# Patient Record
Sex: Male | Born: 1975 | Race: Black or African American | Hispanic: No | Marital: Married | Smoking: Current every day smoker
Health system: Southern US, Community
[De-identification: ages and names within clinical notes are randomized; demographics above are authoritative.]

---

## 2002-04-05 ENCOUNTER — Emergency Department (HOSPITAL_COMMUNITY): Admission: EM | Admit: 2002-04-05 | Discharge: 2002-04-05 | Payer: Self-pay | Admitting: Emergency Medicine

## 2007-03-13 ENCOUNTER — Emergency Department (HOSPITAL_COMMUNITY): Admission: EM | Admit: 2007-03-13 | Discharge: 2007-03-14 | Payer: Self-pay | Admitting: Podiatry

## 2007-03-22 ENCOUNTER — Emergency Department (HOSPITAL_COMMUNITY): Admission: EM | Admit: 2007-03-22 | Discharge: 2007-03-22 | Payer: Self-pay | Admitting: Emergency Medicine

## 2007-03-29 ENCOUNTER — Inpatient Hospital Stay (HOSPITAL_COMMUNITY): Admission: EM | Admit: 2007-03-29 | Discharge: 2007-03-31 | Payer: Self-pay | Admitting: Emergency Medicine

## 2007-03-29 ENCOUNTER — Ambulatory Visit: Payer: Self-pay | Admitting: Internal Medicine

## 2007-04-13 ENCOUNTER — Ambulatory Visit: Payer: Self-pay | Admitting: Internal Medicine

## 2007-04-13 ENCOUNTER — Encounter (INDEPENDENT_AMBULATORY_CARE_PROVIDER_SITE_OTHER): Payer: Self-pay | Admitting: *Deleted

## 2007-04-13 DIAGNOSIS — K859 Acute pancreatitis without necrosis or infection, unspecified: Secondary | ICD-10-CM

## 2007-04-13 DIAGNOSIS — F1010 Alcohol abuse, uncomplicated: Secondary | ICD-10-CM | POA: Insufficient documentation

## 2008-04-30 ENCOUNTER — Emergency Department (HOSPITAL_COMMUNITY): Admission: EM | Admit: 2008-04-30 | Discharge: 2008-04-30 | Payer: Self-pay | Admitting: Emergency Medicine

## 2010-08-18 ENCOUNTER — Inpatient Hospital Stay (HOSPITAL_COMMUNITY)
Admission: EM | Admit: 2010-08-18 | Discharge: 2010-08-19 | Payer: Self-pay | Source: Home / Self Care | Attending: Pulmonary Disease | Admitting: Pulmonary Disease

## 2010-09-15 NOTE — Discharge Summary (Addendum)
NAME:  [name], [name] NO.:  [id number]  MEDICAL RECORD NO.:  [id number]          PATIENT TYPE:  INP  LOCATION:  2306                         FACILITY:  [facility]  PHYSICIAN:  Oretha Milch, MD      DATE OF BIRTH:  [birth year]  DATE OF ADMISSION:  08/18/2010 DATE OF DISCHARGE:  08/19/2010                              DISCHARGE SUMMARY   DISCHARGE DIAGNOSES: 1. Alcohol intoxication. 2. Respiratory failure.  HISTORY OF PRESENT ILLNESS:  Mr. Devon Garcia is a 35 year old male with no previous past medical history who was brought to the emergency room by EMS for altered mental status.  He was noted by bystanders to be drinking alcohol and at some point was witnessed to lose consciousness. He was brought to the emergency room where he was intubated for airway protection and pulmonary critical care was asked to admit.  Of note, alcohol level on admission was 575.  LABORATORY DATA:  At the time of admission, August 13, 1959, urinalysis was negative.  CBC, white blood cells 4.2, hemoglobin 16.2, hematocrit 47.9, platelets 190.  Alcohol level was 575.  Basic metabolic panel, sodium 144, potassium 3.4, glucose 124, BUN 7, creatinine 1.06. Urine drug screen was negative.  Most recent laboratory data, August 19, 2010, base metabolic panel, sodium 139, potassium 3.8, glucose 96, BUN 8, creatinine 0.89, phosphorus 3.5, magnesium 1.5.  MRSA PCR screen was negative.  RADIOLOGY DATA:  Portable chest x-ray on August 18, 2010, shows subsegmental left lower lobe atelectasis.  CT of the head shows no acute findings.  CT of the C-spine also no acute findings.  MICRODATA:  No microdata available this admission.  HOSPITAL COURSE BY DISCHARGE DIAGNOSES: 1. Acute alcohol intoxication.  The patient post extubation admits to     regularly drinking hard liquor.  His alcohol level was 575 on     admission.  At the time of discharge, the patient is awake, alert.     He has had some slight  intermittent signs and symptoms of alcohol     withdrawal, which he does state he has had in the past when he     tried to quit approximately 3 years ago.  He has been treated with     p.r.n. p.o. Ativan, which he will be discharged on.  Social work     has seen the patient in consultation for assistance with substance     abuse.  The patient was treated with thiamine and again has been     urged to stop drinking and social work has consulted with the     patient for outpatient options for substance abuse. 2. Respiratory failure secondary to acute alcohol intoxication.  This     is resolved.  The patient is self-extubated in the early morning of     August 18, 2010, has been extubated for approximately 24 hours,     and his respiratory status is stable.  He is saturating well on     room air with no shortness of breath, no increased work of     breathing with exertion and ambulation.  DISCHARGE MEDICATIONS:  Ativan 1 mg p.o. q.4 h. p.r.n. as needed for shakes, tremors, and signs of alcohol withdrawal  DISCHARGE ACTIVITY:  The patient is discharged with activity as tolerated.  DISCHARGE DIET:  The patient is discharged on regular diet.  FOLLOWUP:  Of note, the patient does not have primary care physician and when asked he refused to search for one and states that he does not need any followup as he has no chronic medical problems.  The patient has been given Dr. Ma Hillock name and office number and can follow up with Pulmonary Critical Care as needed.  DISPOSITION:  The patient has met maximum benefit from his inpatient hospitalization.  His acute alcohol intoxication and respiratory failure resolved.  He will be given a prescription for p.r.n. Ativan for signs and symptoms of alcohol withdrawal and has been seen in consultation by social work for substance abuse.  The patient will be discharged home and can follow up on a p.r.n. basis.     Devon Dress,  NP   ______________________________ Oretha Milch, MD    KW/MEDQ  D:  08/19/2010  T:  08/19/2010  Job:  [id number]  Electronically Signed by Danford Bad N.P. on 09/03/2010 11:34:45 AM Electronically Signed by Cyril Mourning MD on 09/15/2010 09:06:04 PM

## 2010-09-26 ENCOUNTER — Emergency Department (HOSPITAL_COMMUNITY): Payer: Self-pay

## 2010-09-26 ENCOUNTER — Emergency Department (HOSPITAL_COMMUNITY)
Admission: EM | Admit: 2010-09-26 | Discharge: 2010-09-26 | Disposition: A | Discharge status: Home / Self Care | Payer: Self-pay | Attending: Emergency Medicine | Admitting: Emergency Medicine

## 2010-09-26 DIAGNOSIS — M25476 Effusion, unspecified foot: Secondary | ICD-10-CM | POA: Insufficient documentation

## 2010-09-26 DIAGNOSIS — S93409A Sprain of unspecified ligament of unspecified ankle, initial encounter: Secondary | ICD-10-CM | POA: Insufficient documentation

## 2010-09-26 DIAGNOSIS — X500XXA Overexertion from strenuous movement or load, initial encounter: Secondary | ICD-10-CM | POA: Insufficient documentation

## 2010-09-26 DIAGNOSIS — S99919A Unspecified injury of unspecified ankle, initial encounter: Secondary | ICD-10-CM | POA: Insufficient documentation

## 2010-09-26 DIAGNOSIS — Y92009 Unspecified place in unspecified non-institutional (private) residence as the place of occurrence of the external cause: Secondary | ICD-10-CM | POA: Insufficient documentation

## 2010-09-26 DIAGNOSIS — M25473 Effusion, unspecified ankle: Secondary | ICD-10-CM | POA: Insufficient documentation

## 2010-09-26 DIAGNOSIS — S9000XA Contusion of unspecified ankle, initial encounter: Secondary | ICD-10-CM | POA: Insufficient documentation

## 2010-09-26 DIAGNOSIS — S82839A Other fracture of upper and lower end of unspecified fibula, initial encounter for closed fracture: Secondary | ICD-10-CM | POA: Insufficient documentation

## 2010-09-26 DIAGNOSIS — S8990XA Unspecified injury of unspecified lower leg, initial encounter: Secondary | ICD-10-CM | POA: Insufficient documentation

## 2010-11-03 LAB — RAPID URINE DRUG SCREEN, HOSP PERFORMED
Barbiturates: NOT DETECTED
Cocaine: NOT DETECTED

## 2010-11-03 LAB — CBC
HCT: 47.9 % (ref 39.0–52.0)
MCH: 31 pg (ref 26.0–34.0)
MCV: 91.6 fL (ref 78.0–100.0)
RBC: 5.23 MIL/uL (ref 4.22–5.81)
WBC: 4.2 10*3/uL (ref 4.0–10.5)

## 2010-11-03 LAB — URINE MICROSCOPIC-ADD ON

## 2010-11-03 LAB — URINALYSIS, ROUTINE W REFLEX MICROSCOPIC
Bilirubin Urine: NEGATIVE
Glucose, UA: NEGATIVE mg/dL
Leukocytes, UA: NEGATIVE
Nitrite: NEGATIVE
Specific Gravity, Urine: 1.006 (ref 1.005–1.030)
pH: 5.5 (ref 5.0–8.0)

## 2010-11-03 LAB — DIFFERENTIAL
Eosinophils Relative: 0 % (ref 0–5)
Lymphocytes Relative: 45 % (ref 12–46)
Lymphs Abs: 1.9 10*3/uL (ref 0.7–4.0)
Monocytes Absolute: 0.5 10*3/uL (ref 0.1–1.0)
Monocytes Relative: 12 % (ref 3–12)

## 2010-11-03 LAB — BASIC METABOLIC PANEL
BUN: 8 mg/dL (ref 6–23)
CO2: 24 mEq/L (ref 19–32)
CO2: 30 mEq/L (ref 19–32)
Chloride: 103 mEq/L (ref 96–112)
Chloride: 107 mEq/L (ref 96–112)
GFR calc Af Amer: >60 mL/min (ref >60)
Glucose, Bld: 96 mg/dL (ref 70–99)
Potassium: 3.4 mEq/L — ABNORMAL LOW (ref 3.5–5.1)
Potassium: 3.8 mEq/L (ref 3.5–5.1)

## 2010-11-03 LAB — ETHANOL: Alcohol, Ethyl (B): 575 mg/dL (ref 0–10)

## 2010-11-03 LAB — MRSA PCR SCREENING: MRSA by PCR: NEGATIVE

## 2010-11-03 LAB — MAGNESIUM: Magnesium: 1.5 mg/dL (ref 1.5–2.5)

## 2011-01-06 NOTE — Discharge Summary (Signed)
NAME:  Devon Garcia, Devon Garcia NO.:  [id number]   MEDICAL RECORD NO.:  [id number]          PATIENT TYPE:  INP   LOCATION:  5730                         FACILITY:  [facility]   PHYSICIAN:  Jason Coop, MD DATE OF BIRTH:  [birth year]   DATE OF ADMISSION:  03/29/2007  DATE OF DISCHARGE:  03/31/2007                               DISCHARGE SUMMARY   DISCHARGE DIAGNOSES:  1. Pancreatitis, mild.  2. Questionable hypertension.  3. Hypokalemia, secondary to decreased intake.   MEDICATIONS ON DISCHARGE:  1. Percocet 1 pill p.o. p.r.n. q.i.d.  2. Zofran 4 mg p.o. p.r.n. b.i.d.  3. Thiamine 5 mg p.o. daily.  4. Folic acid 1 mg p.o. daily.  5. Omeprazole 20 mg  p.o. daily for 10 days.   DISPOSITION:  The patient is discharged home.  He will see Dr. Laveda Abbe  on August 28 at 2:30 p.m.  He needs to be followed for any persistent  signs of pancreatitis including pain, nausea/vomiting or any other  complications.  His primary care physician is to decide whether and how  long his thiamine and folic acid will be continued.  His primary care  physician should follow his CMP, CBC and lipase done on August 8 and he  is also getting BMET and lipase 1 to 2 days prior to coming to the  clinic. Please follow up his BP. It has been high, diastolic above 90  and systolic above 130 during the hospital stay.   PROCEDURE:  CT scan of the abdomen and pelvis with contrast dated August  5 is positive for findings consistent with acute pancreatitis.  His  lipase on March 29, 2007 was 308.   CONSULTS:  None.   HISTORY OF PRESENT ILLNESS:  A 35 year old African American male who  came with history of 1 1/2 week of increasing abdominal pain and  tenderness.  It is located in the epigastric area and right lower  quadrant.  It was constant and sometimes it radiates to the right flank.  He took Zantac but pain was increasing in severity.  He also had 2  episodes of nonbloody emesis.  The first one 3  days prior to admission  and the second one, 1 day prior to admission.  Also, had some mild  weight loss due to decreased p.o. intake.  No history of blood in the  stools.  No chills or fever.  He has never had any type of pain.   PAST MEDICAL HISTORY:  Insignificant.   SOCIAL HISTORY:  He drinks about 2-3 drinks per day.   PHYSICAL EXAMINATION:  VITAL SIGNS:  Temperature 98.2, blood pressure  123/84, pulse 78, respirations 80, saturating 97% on room air.  GENERAL:  Resting.  ENT:  No oropharyngeal icterus or erythema.  NECK:  Supple.  No JVD or lymphadenopathy.  CHEST:  Clear to auscultation bilaterally.  HEART:  Regular rate and rhythm.  No murmurs, rubs or gallops.  2+  radial pulses bilaterally.  ABDOMEN:  Bowel sounds absent.  Soft and nontender.  No organomegaly.  EXTREMITIES:  No edema.  No cervical or supraclavicular  lymphadenopathy.   LABS ON ADMISSION:  WBC 5.4, hemoglobin 13.8, platelets 244, ANC 3.4,  MCV 90, sodium 130, potassium 3.4, chloride 99, bicarb 31, BUN 8,  creatinine 0.9, glucose 108, bilirubin 1, alk phos 117, SGOT 197, SGPT  107, protein 7.2, albumin 4.2, calcium 9.3, lipase 308, UA positive for  ketones, protein is 30 and trace leukocytes.  CK 255, CK-MB 1.7,  relative index 0.7, alcohol level less than 0.5, LD is 176, UD screen  only positive for opiates, HIV nonreactive.   HOSPITAL COURSE:  1. Pancreatitis.  He was treated with NPO IV fluids, adequate      analgesics and antiemetics and on the next day of admission his      pain is improved and he did not have any nausea or vomiting.  We      started him on a liquid diet and he tolerated it well.  We also      gave him Protonix.  2. Hypertension.  His blood pressures in the hospital have been at the      upper levels.  His diastolic levels have been always more than 90.      His systolic blood pressure always above 130.  Being young, just 35      years old with persistent mildly elevated blood  pressure, he needs      followup for it.  3. Hypokalemia. Repletion.   DISCHARGE LABS:  CBC 5.8, hemoglobin 13.1, platelets 211, MCV 91.5, RDW  13.4, cholesterol 144, triglycerides 61, HDL 65, LDL 68, VLL 12, total  cholesterol to STL ratio 2.2.  Sodium 136, potassium 3.2, chloride 102,  bicarb 27, BUN 3, creatinine 0.79, glucose 112 and calcium 8.9.   Vitals at discharge:  Temperature 98.5, blood pressure 139/91, pulse 73,  respirations 20, saturating 99% on room air.   CONDITION ON DISCHARGE:  Without pain, nausea or vomiting.  Tolerating  liquid diet.      Jason Coop, MD  Electronically Signed     YP/MEDQ  D:  03/31/2007  T:  04/01/2007  Job:  [id number]   cc:   Hollace Hayward, M.D.

## 2011-05-27 LAB — CBC
Hemoglobin: 15.1
MCV: 91.1
RBC: 4.95
WBC: 6.8

## 2011-05-27 LAB — RAPID URINE DRUG SCREEN, HOSP PERFORMED
Amphetamines: NOT DETECTED
Barbiturates: NOT DETECTED
Benzodiazepines: NOT DETECTED
Cocaine: NOT DETECTED
Opiates: NOT DETECTED
Tetrahydrocannabinol: POSITIVE — AB

## 2011-05-27 LAB — DIFFERENTIAL
Eosinophils Absolute: 0
Lymphs Abs: 2.7
Monocytes Relative: 10
Neutrophils Relative %: 50

## 2011-05-27 LAB — URINALYSIS, ROUTINE W REFLEX MICROSCOPIC
Glucose, UA: NEGATIVE
Hgb urine dipstick: NEGATIVE
Protein, ur: NEGATIVE
Specific Gravity, Urine: 1.006

## 2011-06-08 LAB — RAPID URINE DRUG SCREEN, HOSP PERFORMED
Amphetamines: NOT DETECTED
Barbiturates: NOT DETECTED
Benzodiazepines: NOT DETECTED
Opiates: POSITIVE — AB

## 2011-06-08 LAB — DIFFERENTIAL
Basophils Relative: 1
Eosinophils Absolute: 0
Eosinophils Relative: 1
Monocytes Absolute: 0.7
Monocytes Relative: 14 — ABNORMAL HIGH
Neutrophils Relative %: 64

## 2011-06-08 LAB — URINE CULTURE: Colony Count: 4000

## 2011-06-08 LAB — URINALYSIS, ROUTINE W REFLEX MICROSCOPIC
Bilirubin Urine: NEGATIVE
Glucose, UA: NEGATIVE
Hgb urine dipstick: NEGATIVE
Ketones, ur: 15 — AB
Protein, ur: 30 — AB

## 2011-06-08 LAB — BASIC METABOLIC PANEL
BUN: 2 — ABNORMAL LOW
CO2: 27
Chloride: 102
Chloride: 103
Creatinine, Ser: 0.73
Creatinine, Ser: 0.79
GFR calc Af Amer: >60
GFR calc Af Amer: >60
GFR calc non Af Amer: >60
Potassium: 3.2 — ABNORMAL LOW
Sodium: 136

## 2011-06-08 LAB — HIV ANTIBODY (ROUTINE TESTING W REFLEX): HIV: NONREACTIVE

## 2011-06-08 LAB — HEPATIC FUNCTION PANEL
Alkaline Phosphatase: 98
Bilirubin, Direct: 0.3
Total Bilirubin: 1

## 2011-06-08 LAB — CBC
HCT: 38 — ABNORMAL LOW
Hemoglobin: 13.1
Hemoglobin: 13.8
MCHC: 34.4
MCV: 89.9
MCV: 91.5
Platelets: 223
RBC: 4.16 — ABNORMAL LOW
RBC: 4.37
RBC: 4.51
RDW: 13.6
WBC: 5
WBC: 5.8

## 2011-06-08 LAB — LIPID PANEL
Cholesterol: 145
HDL: 65
LDL Cholesterol: 62
Total CHOL/HDL Ratio: 2.1
Total CHOL/HDL Ratio: 2.2
Triglycerides: 89
VLDL: 12
VLDL: 18

## 2011-06-08 LAB — COMPREHENSIVE METABOLIC PANEL
ALT: 107 — ABNORMAL HIGH
AST: 197 — ABNORMAL HIGH
Albumin: 4.2
Alkaline Phosphatase: 117
Glucose, Bld: 108 — ABNORMAL HIGH
Potassium: 3.4 — ABNORMAL LOW
Sodium: 138
Total Protein: 7.2

## 2011-06-08 LAB — CK TOTAL AND CKMB (NOT AT ARMC)
CK, MB: 1.7
Total CK: 255 — ABNORMAL HIGH

## 2011-06-08 LAB — URINE MICROSCOPIC-ADD ON

## 2011-06-08 LAB — LACTATE DEHYDROGENASE: LDH: 176

## 2014-03-27 ENCOUNTER — Emergency Department (HOSPITAL_COMMUNITY): Payer: Self-pay

## 2014-03-27 ENCOUNTER — Encounter (HOSPITAL_COMMUNITY)
Admission: EM | Disposition: A | Discharge status: Home / Self Care | Payer: Self-pay | Source: Home / Self Care | Attending: Emergency Medicine

## 2014-03-27 ENCOUNTER — Encounter (HOSPITAL_COMMUNITY): Payer: Self-pay | Admitting: Emergency Medicine

## 2014-03-27 ENCOUNTER — Ambulatory Visit (HOSPITAL_COMMUNITY)
Admission: EM | Admit: 2014-03-27 | Discharge: 2014-03-28 | Disposition: A | Discharge status: Home / Self Care | Payer: Self-pay | Attending: Emergency Medicine | Admitting: Emergency Medicine

## 2014-03-27 ENCOUNTER — Encounter (HOSPITAL_COMMUNITY): Payer: Self-pay | Admitting: Anesthesiology

## 2014-03-27 ENCOUNTER — Emergency Department (HOSPITAL_COMMUNITY): Payer: Self-pay | Admitting: Anesthesiology

## 2014-03-27 DIAGNOSIS — F17200 Nicotine dependence, unspecified, uncomplicated: Secondary | ICD-10-CM | POA: Insufficient documentation

## 2014-03-27 DIAGNOSIS — S81011A Laceration without foreign body, right knee, initial encounter: Secondary | ICD-10-CM

## 2014-03-27 DIAGNOSIS — M9420 Chondromalacia, unspecified site: Secondary | ICD-10-CM | POA: Insufficient documentation

## 2014-03-27 DIAGNOSIS — Y990 Civilian activity done for income or pay: Secondary | ICD-10-CM | POA: Insufficient documentation

## 2014-03-27 DIAGNOSIS — S81809A Unspecified open wound, unspecified lower leg, initial encounter: Principal | ICD-10-CM

## 2014-03-27 DIAGNOSIS — Y929 Unspecified place or not applicable: Secondary | ICD-10-CM | POA: Insufficient documentation

## 2014-03-27 DIAGNOSIS — S81009A Unspecified open wound, unspecified knee, initial encounter: Secondary | ICD-10-CM | POA: Insufficient documentation

## 2014-03-27 DIAGNOSIS — S91009A Unspecified open wound, unspecified ankle, initial encounter: Principal | ICD-10-CM

## 2014-03-27 DIAGNOSIS — M948X9 Other specified disorders of cartilage, unspecified sites: Secondary | ICD-10-CM | POA: Insufficient documentation

## 2014-03-27 DIAGNOSIS — W261XXA Contact with sword or dagger, initial encounter: Secondary | ICD-10-CM

## 2014-03-27 DIAGNOSIS — W260XXA Contact with knife, initial encounter: Secondary | ICD-10-CM | POA: Insufficient documentation

## 2014-03-27 DIAGNOSIS — Y93H9 Activity, other involving exterior property and land maintenance, building and construction: Secondary | ICD-10-CM | POA: Insufficient documentation

## 2014-03-27 HISTORY — PX: KNEE ARTHROSCOPY: SHX127

## 2014-03-27 LAB — BASIC METABOLIC PANEL
ANION GAP: 20 — AB (ref 5–15)
BUN: 14 mg/dL (ref 6–23)
CHLORIDE: 102 meq/L (ref 96–112)
CO2: 21 mEq/L (ref 19–32)
CREATININE: 0.84 mg/dL (ref 0.50–1.35)
Calcium: 9.3 mg/dL (ref 8.4–10.5)
GFR calc non Af Amer: >90 mL/min (ref >90)
Glucose, Bld: 100 mg/dL — ABNORMAL HIGH (ref 70–99)
POTASSIUM: 4 meq/L (ref 3.7–5.3)
SODIUM: 143 meq/L (ref 137–147)

## 2014-03-27 LAB — CBC
HCT: 37.1 % — ABNORMAL LOW (ref 39.0–52.0)
Hemoglobin: 12.5 g/dL — ABNORMAL LOW (ref 13.0–17.0)
MCH: 29.9 pg (ref 26.0–34.0)
MCHC: 33.7 g/dL (ref 30.0–36.0)
MCV: 88.8 fL (ref 78.0–100.0)
Platelets: 155 10*3/uL (ref 150–400)
RBC: 4.18 MIL/uL — ABNORMAL LOW (ref 4.22–5.81)
RDW: 14.6 % (ref 11.5–15.5)
WBC: 12.1 10*3/uL — AB (ref 4.0–10.5)

## 2014-03-27 SURGERY — ARTHROSCOPY, KNEE
Anesthesia: General | Site: Knee | Laterality: Right

## 2014-03-27 MED ORDER — ONDANSETRON HCL 4 MG/2ML IJ SOLN
4.0000 mg | Freq: Once | INTRAMUSCULAR | Status: AC | PRN
  Administered 2014-03-27: 4 mg via INTRAVENOUS

## 2014-03-27 MED ORDER — EPINEPHRINE HCL 1 MG/ML IJ SOLN
INTRAMUSCULAR | Status: DC | PRN
  Administered 2014-03-27: 1 mg

## 2014-03-27 MED ORDER — LIDOCAINE HCL (CARDIAC) 20 MG/ML IV SOLN
INTRAVENOUS | Status: DC | PRN
Start: 2014-03-27 — End: 2014-03-27
  Administered 2014-03-27: 100 mg via INTRAVENOUS

## 2014-03-27 MED ORDER — HYDROMORPHONE HCL PF 1 MG/ML IJ SOLN
INTRAMUSCULAR | Status: AC
  Filled 2014-03-27: qty 1

## 2014-03-27 MED ORDER — OXYCODONE HCL 5 MG/5ML PO SOLN: 5.0000 mg | Freq: Once | ORAL | Status: AC | PRN

## 2014-03-27 MED ORDER — OXYCODONE-ACETAMINOPHEN 5-325 MG PO TABS
1.0000 | ORAL_TABLET | Freq: Four times a day (QID) | ORAL | Status: AC | PRN
Start: 2014-03-27 — End: ?

## 2014-03-27 MED ORDER — ROCURONIUM BROMIDE 50 MG/5ML IV SOLN
INTRAVENOUS | Status: AC
  Filled 2014-03-27: qty 1

## 2014-03-27 MED ORDER — DEXAMETHASONE SODIUM PHOSPHATE 4 MG/ML IJ SOLN
INTRAMUSCULAR | Status: DC | PRN
  Administered 2014-03-27: 8 mg via INTRAVENOUS

## 2014-03-27 MED ORDER — SUCCINYLCHOLINE CHLORIDE 20 MG/ML IJ SOLN
INTRAMUSCULAR | Status: DC | PRN
  Administered 2014-03-27: 140 mg via INTRAVENOUS

## 2014-03-27 MED ORDER — OXYCODONE-ACETAMINOPHEN 5-325 MG PO TABS
2.0000 | ORAL_TABLET | Freq: Once | ORAL | Status: AC
  Administered 2014-03-27: 2 via ORAL
  Filled 2014-03-27: qty 2

## 2014-03-27 MED ORDER — GLYCOPYRROLATE 0.2 MG/ML IJ SOLN
INTRAMUSCULAR | Status: AC
  Filled 2014-03-27: qty 3

## 2014-03-27 MED ORDER — NEOSTIGMINE METHYLSULFATE 10 MG/10ML IV SOLN
INTRAVENOUS | Status: AC
  Filled 2014-03-27: qty 1

## 2014-03-27 MED ORDER — LACTATED RINGERS IV SOLN
INTRAVENOUS | Status: DC | PRN
  Administered 2014-03-27 (×2): via INTRAVENOUS

## 2014-03-27 MED ORDER — LIDOCAINE HCL (CARDIAC) 20 MG/ML IV SOLN
INTRAVENOUS | Status: AC
  Filled 2014-03-27: qty 5

## 2014-03-27 MED ORDER — EPINEPHRINE HCL 1 MG/ML IJ SOLN
INTRAMUSCULAR | Status: AC
  Filled 2014-03-27: qty 1

## 2014-03-27 MED ORDER — HYDROMORPHONE HCL PF 1 MG/ML IJ SOLN
0.2500 mg | INTRAMUSCULAR | Status: DC | PRN
  Administered 2014-03-27 (×4): 0.5 mg via INTRAVENOUS

## 2014-03-27 MED ORDER — BUPIVACAINE HCL (PF) 0.25 % IJ SOLN: INTRAMUSCULAR | Status: DC | PRN

## 2014-03-27 MED ORDER — CEPHALEXIN 500 MG PO CAPS: 500.0000 mg | ORAL_CAPSULE | Freq: Three times a day (TID) | ORAL | Status: AC

## 2014-03-27 MED ORDER — CEFAZOLIN SODIUM 1-5 GM-% IV SOLN
INTRAVENOUS | Status: AC
  Filled 2014-03-27: qty 50

## 2014-03-27 MED ORDER — SODIUM CHLORIDE 0.9 % IR SOLN
Status: DC | PRN
  Administered 2014-03-27: 3000 mL

## 2014-03-27 MED ORDER — FENTANYL CITRATE 0.05 MG/ML IJ SOLN
INTRAMUSCULAR | Status: DC | PRN
  Administered 2014-03-27 (×2): 50 ug via INTRAVENOUS

## 2014-03-27 MED ORDER — CEFAZOLIN SODIUM-DEXTROSE 2-3 GM-% IV SOLR
2.0000 g | Freq: Once | INTRAVENOUS | Status: AC
Start: 2014-03-27 — End: 2014-03-27
  Administered 2014-03-27: 2 g via INTRAVENOUS
  Filled 2014-03-27: qty 50

## 2014-03-27 MED ORDER — BUPIVACAINE HCL (PF) 0.25 % IJ SOLN
INTRAMUSCULAR | Status: AC
  Filled 2014-03-27: qty 30

## 2014-03-27 MED ORDER — ONDANSETRON HCL 4 MG/2ML IJ SOLN
INTRAMUSCULAR | Status: DC | PRN
  Administered 2014-03-27: 4 mg via INTRAVENOUS

## 2014-03-27 MED ORDER — PROPOFOL 10 MG/ML IV BOLUS
INTRAVENOUS | Status: AC
  Filled 2014-03-27: qty 20

## 2014-03-27 MED ORDER — OXYCODONE HCL 5 MG PO TABS
ORAL_TABLET | ORAL | Status: AC
  Filled 2014-03-27: qty 1

## 2014-03-27 MED ORDER — OXYCODONE HCL 5 MG PO TABS
5.0000 mg | ORAL_TABLET | Freq: Once | ORAL | Status: AC | PRN
  Administered 2014-03-27: 5 mg via ORAL

## 2014-03-27 MED ORDER — FENTANYL CITRATE 0.05 MG/ML IJ SOLN
INTRAMUSCULAR | Status: AC
  Filled 2014-03-27: qty 5

## 2014-03-27 MED ORDER — ONDANSETRON HCL 4 MG/2ML IJ SOLN
INTRAMUSCULAR | Status: AC
  Filled 2014-03-27: qty 2

## 2014-03-27 MED ORDER — PROPOFOL 10 MG/ML IV BOLUS
INTRAVENOUS | Status: DC | PRN
  Administered 2014-03-27: 150 mg via INTRAVENOUS

## 2014-03-27 MED ORDER — CEFAZOLIN SODIUM-DEXTROSE 2-3 GM-% IV SOLR
INTRAVENOUS | Status: DC | PRN
  Administered 2014-03-27: 2 g via INTRAVENOUS

## 2014-03-27 MED ORDER — MIDAZOLAM HCL 5 MG/5ML IJ SOLN
INTRAMUSCULAR | Status: DC | PRN
  Administered 2014-03-27: 2 mg via INTRAVENOUS

## 2014-03-27 MED ORDER — MIDAZOLAM HCL 2 MG/2ML IJ SOLN
INTRAMUSCULAR | Status: AC
  Filled 2014-03-27: qty 2

## 2014-03-27 MED ORDER — TETANUS-DIPHTH-ACELL PERTUSSIS 5-2.5-18.5 LF-MCG/0.5 IM SUSP
0.5000 mL | Freq: Once | INTRAMUSCULAR | Status: AC
  Administered 2014-03-27: 0.5 mL via INTRAMUSCULAR
  Filled 2014-03-27: qty 0.5

## 2014-03-27 SURGICAL SUPPLY — 39 items
BANDAGE ELASTIC 6 VELCRO ST LF (GAUZE/BANDAGES/DRESSINGS) ×3 IMPLANT
BLADE CUDA 5.5 (BLADE) IMPLANT
BLADE CUTTER GATOR 3.5 (BLADE) IMPLANT
BLADE GREAT WHITE 4.2 (BLADE) ×2 IMPLANT
BLADE GREAT WHITE 4.2MM (BLADE) ×1
BNDG GAUZE ELAST 4 BULKY (GAUZE/BANDAGES/DRESSINGS) ×3 IMPLANT
CUFF TOURNIQUET SINGLE 34IN LL (TOURNIQUET CUFF) IMPLANT
CUFF TOURNIQUET SINGLE 44IN (TOURNIQUET CUFF) IMPLANT
DRAPE ARTHROSCOPY W/POUCH 114 (DRAPES) ×3 IMPLANT
DRAPE PROXIMA HALF (DRAPES) ×3 IMPLANT
DRAPE U-SHAPE 47X51 STRL (DRAPES) ×3 IMPLANT
DRSG EMULSION OIL 3X3 NADH (GAUZE/BANDAGES/DRESSINGS) ×3 IMPLANT
DURAPREP 26ML APPLICATOR (WOUND CARE) ×3 IMPLANT
FILTER STRAW FLUID ASPIR (MISCELLANEOUS) ×3 IMPLANT
GAUZE SPONGE 4X4 12PLY STRL (GAUZE/BANDAGES/DRESSINGS) ×3 IMPLANT
GLOVE BIOGEL PI IND STRL 8 (GLOVE) ×2 IMPLANT
GLOVE BIOGEL PI INDICATOR 8 (GLOVE) ×4
GLOVE ECLIPSE 7.5 STRL STRAW (GLOVE) ×6 IMPLANT
GOWN STRL REUS W/ TWL LRG LVL3 (GOWN DISPOSABLE) ×1 IMPLANT
GOWN STRL REUS W/ TWL XL LVL3 (GOWN DISPOSABLE) ×2 IMPLANT
GOWN STRL REUS W/TWL LRG LVL3 (GOWN DISPOSABLE) ×2
GOWN STRL REUS W/TWL XL LVL3 (GOWN DISPOSABLE) ×4
IMMOBILIZER KNEE 20 (SOFTGOODS) ×3
IMMOBILIZER KNEE 20 THIGH 36 (SOFTGOODS) ×1 IMPLANT
KIT BASIN OR (CUSTOM PROCEDURE TRAY) ×3 IMPLANT
KIT ROOM TURNOVER OR (KITS) ×3 IMPLANT
NEEDLE 18GX1X1/2 (RX/OR ONLY) (NEEDLE) ×3 IMPLANT
PACK ARTHROSCOPY DSU (CUSTOM PROCEDURE TRAY) ×3 IMPLANT
PAD ABD 8X10 STRL (GAUZE/BANDAGES/DRESSINGS) ×3 IMPLANT
PAD ARMBOARD 7.5X6 YLW CONV (MISCELLANEOUS) ×6 IMPLANT
SET ARTHROSCOPY TUBING (MISCELLANEOUS) ×2
SET ARTHROSCOPY TUBING LN (MISCELLANEOUS) ×1 IMPLANT
SPONGE GAUZE 4X4 12PLY STER LF (GAUZE/BANDAGES/DRESSINGS) ×3 IMPLANT
SUT ETHILON 4 0 PS 2 18 (SUTURE) ×3 IMPLANT
SUT VIC AB 2-0 CT1 27 (SUTURE) ×2
SUT VIC AB 2-0 CT1 TAPERPNT 27 (SUTURE) ×1 IMPLANT
SYR 5ML LL (SYRINGE) ×3 IMPLANT
TOWEL OR 17X24 6PK STRL BLUE (TOWEL DISPOSABLE) ×3 IMPLANT
TOWEL OR 17X26 10 PK STRL BLUE (TOWEL DISPOSABLE) ×3 IMPLANT

## 2014-03-27 NOTE — ED Notes (Signed)
Pt was working outside and cutting trees with a machete and cut his right lateral knee.  Slight oozing of blood.  Distal pulses present and palpable.   Pt states it feels like there is fluid in his knee

## 2014-03-27 NOTE — Anesthesia Procedure Notes (Signed)
Procedure Name: Intubation Date/Time: 03/27/2014 9:57 PM Performed by: Armandina GemmaMIRARCHI, Krystale Rinkenberger Pre-anesthesia Checklist: Patient identified, Timeout performed, Emergency Drugs available, Suction available and Patient being monitored Patient Re-evaluated:Patient Re-evaluated prior to inductionOxygen Delivery Method: Circle system utilized Preoxygenation: Pre-oxygenation with 100% oxygen Ventilation: Mask ventilation without difficulty Laryngoscope Size: Miller and 2 Grade View: Grade I Tube type: Oral Tube size: 7.5 mm Number of attempts: 1 Airway Equipment and Method: Stylet Placement Confirmation: ETT inserted through vocal cords under direct vision,  positive ETCO2 and breath sounds checked- equal and bilateral Secured at: 23 cm Tube secured with: Tape Dental Injury: Teeth and Oropharynx as per pre-operative assessment  Comments: IV induction crews intubation AM CRNA atraumatic- teeth and mouth as preop- pt missing upper front teeth- plate removed in holding and given to circulating RN- Synetta FailAnita- + Bilat BS Crews- + ETCO2

## 2014-03-27 NOTE — Progress Notes (Signed)
Orthopedic Tech Progress Note Patient Details:  Crecencio McWilliam Dorko Aug 13, 1976 [id number]  Ortho Devices Type of Ortho Device: Crutches Ortho Device/Splint Interventions: Ordered;Adjustment   Malachi BondsGriffin, Emilliano Ray 03/27/2014, 11:13 PM

## 2014-03-27 NOTE — Discharge Instructions (Signed)
Wear knee immobilizer when walking. Use crutches as needed. In a couple of days you can remove the knee immobilizer and do gentle range of motion exercises. Ice to your knee for 3 or 4 days. Keep the dressing intact.

## 2014-03-27 NOTE — Transfer of Care (Signed)
Immediate Anesthesia Transfer of Care Note  Patient: Devon Garcia  Procedure(s) Performed: Procedure(s): right knee arthroscopy with synovectomy, incisional debridement of skin, subcutaneous tissue and fascia.  wound closure. (Right)  Patient Location: PACU  Anesthesia Type:General  Level of Consciousness: awake, alert , oriented and patient cooperative  Airway & Oxygen Therapy: Patient Spontanous Breathing and Patient connected to nasal cannula oxygen  Post-op Assessment: Report given to PACU RN and Post -op Vital signs reviewed and stable  Post vital signs: Reviewed  Complications: No apparent anesthesia complications

## 2014-03-27 NOTE — ED Provider Notes (Signed)
CSN: [id number]     Arrival date & time 03/27/14  1843 History  This chart was scribed for non-physician practitioner working with Hurman HornJohn M Bednar, MD, by Modena JanskyAlbert Thayil, ED Scribe. This patient was seen in room TR10C/TR10C and the patient's care was started at 7:13 PM.   Chief Complaint  Patient presents with  . Laceration    Right knee   HPI Comments: Devon Garcia is a 38 y.o. Male with a past medical history of alcohol abuse who presents to the Emergency Department complaining of RLE laceration that occured about 3 hours ago. He states that he cut his right knee with a machete by accident while cutting trees. He reports that his Tetanus is unknown. He states that he has no known allergies.  Reports 1 beer at 1000 today and 1 beer after the injury occurred, reports vomiting up the whole beer right after he drank it.  Reports 3-4 beers a day.  NO PCP  The history is provided by the patient. No language interpreter was used.    History reviewed. No pertinent past medical history. History reviewed. No pertinent past surgical history. No family history on file. History  Substance Use Topics  . Smoking status: Current Every Day Smoker  . Smokeless tobacco: Not on file  . Alcohol Use: Yes     Comment: daily    Review of Systems  Musculoskeletal: Positive for arthralgias, gait problem and joint swelling.  Skin: Positive for wound.  All other systems reviewed and are negative.     Allergies  Review of patient's allergies indicates no known allergies.  Home Medications   Prior to Admission medications   Not on File   BP 108/69  Pulse 103  Temp(Src) 98.2 F (36.8 C) (Oral)  Resp 18  SpO2 97% Physical Exam  Nursing note and vitals reviewed. Constitutional: He is oriented to person, place, and time. He appears well-developed and well-nourished.  Non-toxic appearance. He does not have a sickly appearance. He does not appear ill. No distress.  HENT:  Head: Normocephalic.  Neck:  Neck supple.  Pulmonary/Chest: Effort normal. No respiratory distress.  Musculoskeletal: Normal range of motion.       Right knee: He exhibits swelling and laceration. He exhibits normal range of motion, no ecchymosis and no deformity. Tenderness found.  Right knee: Approximately 1cm laceration to the lateral aspect of the knee. Minimal bleeding. No obvious FB.  Mild-moderate swelling to the joint.  Neurological: He is alert and oriented to person, place, and time.  Skin: Skin is warm and dry. He is not diaphoretic.  Psychiatric: He has a normal mood and affect. His behavior is normal.    ED Course  Procedures (including critical care time)  COORDINATION OF CARE: 7:17 PM- Pt advised of plan for treatment which includes medication and radiology and pt agrees.  Labs Review Labs Reviewed  CBC - Abnormal; Notable for the following:    WBC 12.1 (*)    RBC 4.18 (*)    Hemoglobin 12.5 (*)    HCT 37.1 (*)    All other components within normal limits  BASIC METABOLIC PANEL - Abnormal; Notable for the following:    Glucose, Bld 100 (*)    Anion gap 20 (*)    All other components within normal limits    Imaging Review Dg Knee Complete 4 Views Right  03/27/2014   CLINICAL DATA:  Laceration involving the lateral portion of the knee  EXAM: RIGHT KNEE - COMPLETE 4+ VIEW  COMPARISON:  None.  FINDINGS: There are ill-defined foci of subcutaneous emphysema about the lateral aspect of the knee. This finding is associated with a large amount of intra-articular air. No associated fracture, dislocation or radiopaque foreign body. Joint spaces are preserved. No definite joint effusion.  IMPRESSION: 1. Ill-defined foci of subcutaneous emphysema involving the lateral aspect of the knee with associated large amount of intra-articular air. 2. No associated fracture or radiopaque foreign body.   Electronically Signed   By: Simonne Come M.D.   On: 03/27/2014 19:55     EKG Interpretation None      MDM    Final diagnoses:  Laceration of right knee with complication, initial encounter   Patient presents after sustaining a laceration to his right knee, moderate swelling on exam, unable to visualize the wound floor. Plan to x-ray, update Tdap Discussed pt history, condition, XR results discussed with Dr. Luiz Blare who agrees to take the patient to the OR for a wash out. Discussed treatment plan with the patient.  Meds given in ED:  Medications  Tdap (BOOSTRIX) injection 0.5 mL (0.5 mLs Intramuscular Given 03/27/14 1924)  oxyCODONE-acetaminophen (PERCOCET/ROXICET) 5-325 MG per tablet 2 tablet (2 tablets Oral Given 03/27/14 1924)    New Prescriptions   No medications on file   I personally performed the services described in this documentation, which was scribed in my presence. The recorded information has been reviewed and is accurate.    Mellody Drown, PA-C 03/28/14 0230

## 2014-03-27 NOTE — Anesthesia Preprocedure Evaluation (Addendum)
Anesthesia Evaluation  Patient identified by MRN, date of birth, ID band Patient awake    Reviewed: Allergy & Precautions, H&P , NPO status , Patient's Chart, lab work & pertinent test results  Airway Mallampati: I TM Distance: >3 FB Neck ROM: Full    Dental  (+) Teeth Intact, Dental Advisory Given   Pulmonary Current Smoker,  breath sounds clear to auscultation        Cardiovascular Rhythm:Regular Rate:Normal     Neuro/Psych    GI/Hepatic   Endo/Other    Renal/GU      Musculoskeletal   Abdominal   Peds  Hematology   Anesthesia Other Findings Hx of "alcohol poisoning" 2 years ago.  Reproductive/Obstetrics                          Anesthesia Physical Anesthesia Plan  ASA: I and emergent  Anesthesia Plan: General   Post-op Pain Management:    Induction: Intravenous, Rapid sequence and Cricoid pressure planned  Airway Management Planned: Oral ETT  Additional Equipment:   Intra-op Plan:   Post-operative Plan: Extubation in OR  Informed Consent: I have reviewed the patients History and Physical, chart, labs and discussed the procedure including the risks, benefits and alternatives for the proposed anesthesia with the patient or authorized representative who has indicated his/her understanding and acceptance.   Dental advisory given  Plan Discussed with: CRNA, Anesthesiologist and Surgeon  Anesthesia Plan Comments:         Anesthesia Quick Evaluation

## 2014-03-27 NOTE — H&P (Signed)
PREOPERATIVE H&P  Chief Complaint: r knee laceration with intra articular extension  HPI: Devon Garcia is a 38 y.o. male who presents for evaluation of r knee lac. It has been present for several hours and has been worsening. He has failed conservative measures. Pain is rated as severe.  History reviewed. No pertinent past medical history. History reviewed. No pertinent past surgical history. History   Social History  . Marital Status: Married    Spouse Name: N/A    Number of Children: N/A  . Years of Education: N/A   Social History Main Topics  . Smoking status: Current Every Day Smoker  . Smokeless tobacco: None  . Alcohol Use: Yes     Comment: daily  . Drug Use: No  . Sexual Activity: None   Other Topics Concern  . None   Social History Narrative  . None   No family history on file. No Known Allergies Prior to Admission medications   Not on File     Positive ROS: none  All other systems have been reviewed and were otherwise negative with the exception of those mentioned in the HPI and as above.  Physical Exam: Filed Vitals:   03/27/14 2114  BP: 127/81  Pulse: 102  Temp: 98.7 F (37.1 C)  Resp: 20    General: Alert, no acute distress Cardiovascular: No pedal edema Respiratory: No cyanosis, no use of accessory musculature GI: No organomegaly, abdomen is soft and non-tender Skin: No lesions in the area of chief complaint Neurologic: Sensation intact distally Psychiatric: Patient is competent for consent with normal mood and affect Lymphatic: No axillary or cervical lymphadenopathy  MUSCULOSKELETAL: r knee 1 cm lac over lat aspect  x-ray: air in knee joint  Recent Results (from the past 2160 hour(s))  CBC     Status: Abnormal   Collection Time    03/27/14  8:40 PM      Result Value Ref Range   WBC 12.1 (*) 4.0 - 10.5 K/uL   RBC 4.18 (*) 4.22 - 5.81 MIL/uL   Hemoglobin 12.5 (*) 13.0 - 17.0 g/dL   HCT 37.1 (*) 39.0 - 52.0 %   MCV 88.8  78.0 -  100.0 fL   MCH 29.9  26.0 - 34.0 pg   MCHC 33.7  30.0 - 36.0 g/dL   RDW 14.6  11.5 - 15.5 %   Platelets 155  150 - 400 K/uL  BASIC METABOLIC PANEL     Status: Abnormal   Collection Time    03/27/14  8:40 PM      Result Value Ref Range   Sodium 143  137 - 147 mEq/L   Potassium 4.0  3.7 - 5.3 mEq/L   Chloride 102  96 - 112 mEq/L   CO2 21  19 - 32 mEq/L   Glucose, Bld 100 (*) 70 - 99 mg/dL   BUN 14  6 - 23 mg/dL   Creatinine, Ser 0.84  0.50 - 1.35 mg/dL   Calcium 9.3  8.4 - 10.5 mg/dL   GFR calc non Af Amer >90  >90 mL/min   GFR calc Af Amer >90  >90 mL/min   Comment: (NOTE)     The eGFR has been calculated using the CKD EPI equation.     This calculation has not been validated in all clinical situations.     eGFR's persistently <90 mL/min signify possible Chronic Kidney     Disease.   Anion gap 20 (*) 5 - 15  Assessment/Plan: Right knee Injury laceration with intra-articular extension Plan for Procedure(s): ARTHROSCOPY KNEE and wound closure  The risks benefits and alternatives were discussed with the patient including but not limited to the risks of nonoperative treatment, versus surgical intervention including infection, bleeding, nerve injury, malunion, nonunion, hardware prominence, hardware failure, need for hardware removal, blood clots, cardiopulmonary complications, morbidity, mortality, among others, and they were willing to proceed.  Predicted outcome is good, although there will be at least a six to nine month expected recovery.  Alta Corning, MD 03/27/2014 9:18 PM

## 2014-03-27 NOTE — Anesthesia Postprocedure Evaluation (Signed)
  Anesthesia Post-op Note  Patient: Devon Garcia  Procedure(s) Performed: Procedure(s): right knee arthroscopy with synovectomy, incisional debridement of skin, subcutaneous tissue and fascia.  wound closure. (Right)  Patient Location: PACU  Anesthesia Type: General   Level of Consciousness: awake, alert  and oriented  Airway and Oxygen Therapy: Patient Spontanous Breathing  Post-op Pain: mild  Post-op Assessment: Post-op Vital signs reviewed  Post-op Vital Signs: Reviewed  Last Vitals:  Filed Vitals:   03/27/14 2315  BP: 149/91  Pulse: 114  Temp:   Resp: 17    Complications: No apparent anesthesia complications

## 2014-03-27 NOTE — Brief Op Note (Signed)
03/27/2014  10:34 PM  PATIENT:  Devon Garcia  38 y.o. male  PRE-OPERATIVE DIAGNOSIS:  Right knee Injury  POST-OPERATIVE DIAGNOSIS:  Right knee Injury  PROCEDURE:  Procedure(s): right knee arthroscopy with synovectomy, incisional debridement of skin, subcutaneous tissue and fascia.  wound closure. (Right)  SURGEON:  Surgeon(s) and Role:    * Harvie JuniorJohn L Brigetta Beckstrom, MD - Primary  PHYSICIAN ASSISTANT:   ASSISTANTS: bethune   ANESTHESIA:   general  EBL:  Total I/O In: 1000 [I.V.:1000] Out: -   BLOOD ADMINISTERED:none  DRAINS: none   LOCAL MEDICATIONS USED:  MARCAINE     SPECIMEN:  No Specimen  DISPOSITION OF SPECIMEN:  N/A  COUNTS:  YES  TOURNIQUET:   Total Tourniquet Time Documented: Thigh (Right) - 21 minutes Total: Thigh (Right) - 21 minutes   DICTATION: .Other Dictation: Dictation Number [id number]  PLAN OF CARE: Discharge to home after PACU  PATIENT DISPOSITION:  PACU - hemodynamically stable.   Delay start of Pharmacological VTE agent (>24hrs) due to surgical blood loss or risk of bleeding: no

## 2014-03-28 MED ORDER — ONDANSETRON HCL 4 MG/2ML IJ SOLN
INTRAMUSCULAR | Status: AC
  Filled 2014-03-28: qty 2

## 2014-03-28 NOTE — Op Note (Signed)
NAME:  Devon Garcia, Kaylor               ACCOUNT NO.:  [id number]  MEDICAL RECORD NO.:  [id number]  LOCATION:  [facility]                         FACILITY:  [facility]  PHYSICIAN:  Harvie JuniorJohn L. Dezaree Tracey, M.D.   DATE OF BIRTH:  1976/04/24  DATE OF PROCEDURE:  03/27/2014 DATE OF DISCHARGE:  03/28/2014                              OPERATIVE REPORT   PREOPERATIVE DIAGNOSIS:  Open joint, right.  POSTOPERATIVE DIAGNOSIS:  Open joint, right.  PRINCIPAL PROCEDURES: 1. Arthroscopy with synovectomy, right knee with irrigation and     debridement and 6 liters of normal saline irrigation. 2. Excisional debridement of skin, subcutaneous tissue, and fascia     done with knife and pickups, and primary joint closure.  SURGEON:  Harvie JuniorJohn L. Montrail Mehrer, M.D.  ASSISTANT:  Marshia LyJames Bethune, P.A.  ANESTHESIA:  General.  BRIEF HISTORY:  Mr. 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  He had a very small laceration, but after short period of time, began having a weird noise coming from his knee.  He was concerned and he came in to the emergency room.  X- rays were taken, which showed large amounts of air in the joint.  We had a discussion with the patient, but given that he had an open opening into the joint with a machete, we felt that washout would be appropriate.  He was brought to the operating room for this procedure.  DESCRIPTION OF PROCEDURE:  The patient was brought to the operating room.  After adequate anesthesia was obtained with general anesthetic, the patient was placed supine on the operating table.  The right leg was prepped and draped in usual sterile fashion.  Following this, the leg was elevated and blood pressure tourniquet was inflated to 300 mmHg. Following this, the small laceration was ellipsed completely along with subcutaneous tissue and fat, went right down to the joint capsule and there was a very small 1 cm laceration in the capsule.  At this point, we made  our arthroscopy portals and did a thorough inspection of the knee joint.  There was no chondral injury.  We took a probe right through this opening in the joint, went right down to it and did a synovectomy of the area right where this came through, debrided the bone really the side of the femur where this came through to make sure there was no question that anything had caught or got up into bone.  Once this synovectomy was performed, the knee was thoroughly investigated, the patellofemoral joint looked pristine, went down in the medial compartment.  Medial meniscus was good.  Small amounts of chondromalacia not enough to debride in the medial compartment.  ACL was normal. Lateral side had a small amount of chondromalacia, but otherwise looked good.  The lateral meniscus looked good.  Popliteus tendon was identified and looked good.  Six liters of normal saline irrigation and suction was irrigated through the knee and suctioned out.  Tremendous amount of came out through this small opening and the wound while we were irrigating the knee.  At this point and while we still had water to come in, we took 2-0  Vicryl and closed the joint capsule.  We then put water into the knee and a significant pressure to make sure it was watertight.  At this point, the skin was closed with 4-0 nylon interrupted sutures.  A sterile compressive dressing was applied.  The patient was taken to the recovery room, he was noted to be in satisfactory condition.  Estimated blood loss for the procedure was none.     Harvie Junior, M.D.     Ranae Plumber  D:  03/27/2014  T:  03/28/2014  Job:  [id number]

## 2014-03-29 ENCOUNTER — Encounter (HOSPITAL_COMMUNITY): Payer: Self-pay | Admitting: Orthopedic Surgery

## 2014-03-31 NOTE — ED Provider Notes (Signed)
Medical screening examination/treatment/procedure(s) were performed by non-physician practitioner and as supervising physician I was immediately available for consultation/collaboration.   EKG Interpretation None       Hurman HornJohn M Jahari Wiginton, MD 03/31/14 [phone]

## 2014-09-02 ENCOUNTER — Emergency Department (HOSPITAL_COMMUNITY)
Admission: EM | Admit: 2014-09-02 | Discharge: 2014-09-24 | Disposition: E | Payer: Self-pay | Attending: Emergency Medicine | Admitting: Emergency Medicine

## 2014-09-02 DIAGNOSIS — Z8719 Personal history of other diseases of the digestive system: Secondary | ICD-10-CM | POA: Insufficient documentation

## 2014-09-02 DIAGNOSIS — R1110 Vomiting, unspecified: Secondary | ICD-10-CM | POA: Insufficient documentation

## 2014-09-02 DIAGNOSIS — I469 Cardiac arrest, cause unspecified: Secondary | ICD-10-CM | POA: Insufficient documentation

## 2014-09-02 LAB — CBG MONITORING, ED: Glucose-Capillary: 227 mg/dL — ABNORMAL HIGH (ref 70–99)

## 2014-09-02 MED ORDER — EPINEPHRINE HCL 0.1 MG/ML IJ SOSY
PREFILLED_SYRINGE | INTRAMUSCULAR | Status: AC | PRN
  Administered 2014-09-02: 1 mg via INTRAVENOUS

## 2014-09-02 NOTE — Progress Notes (Signed)
Chaplain met family when they gathered in consultation room. With RN permission, they took turns visiting pt. Room with [employer].  After visitation, MD met with them to give information and support.    Sister Next of Kin, gave information:  Dortha Schwalbe, [phone], [address] [place], [place]  [id number]  Understands that pt. Is medical examiners case...and they will receive information when body is available.  Chaplain gave sister pt's wallet and phone.  Fiancee of pt., Charlena Cross, previously took his shoes and belt.  Rev. [place], [place]

## 2014-09-02 NOTE — Progress Notes (Signed)
Chaplain reasoned to CPR in ED of 39 year old male.  The patient was unresponsive at the time of arrival to the hospital.  Extensive medical intervention was done to revve patient but was unable to do so and patient died.  There was no family present at the time, family and friends later arrived at hospital as news of his death was known.  His girlfriend whom he lived with was very up-set upon her arrival and was in denial as to patients death.  The on-coming Chaplain will continue with follow-up with the family as they arrive. On-Call Chaplain Janell QuietAudrey Thornton [phone]

## 2014-09-02 NOTE — ED Provider Notes (Signed)
CSN: [id number]     Arrival date & time 11/12/2014  1859 History   First MD Initiated Contact with Patient 003/21/2016 1910     Chief Complaint  Patient presents with  . CPR      (Consider location/radiation/quality/duration/timing/severity/associated sxs/prior Treatment) HPI Comments: Limited history on EMS arrival-38yo found down, unknown downtime, no bystander CPR.  Fiance (reports they were talking about ring shopping) came to ED and reported that patient had been drinking this morning, went to bed, and when she went back to see him this afternoon found him down and limp and immediately called EMS.  Denies him having symptoms recently, including no cough. Denies known head trauma. Denies new medications and drug use.  Fiance in significant distress.   Patient is a 39 y.o. male presenting with general illness.  Illness Location:  Cardiac arrest Quality:  Asystole Severity:  Severe Onset quality:  Sudden Duration: unknown down time, asystole on arrival 40 minutes of CPR. Timing:  Constant Progression:  Unchanged Chronicity:  New Context:  Found down, unknown down time, asystole on arrival Ineffective treatments:  Epinephrine Risk factors:  Alcoholism, pancreatits   No past medical history on file. No past surgical history on file. No family history on file. History  Substance Use Topics  . Smoking status: Not on file  . Smokeless tobacco: Not on file  . Alcohol Use: Not on file    Review of Systems  Unable to perform ROS: Intubated      Allergies  Review of patient's allergies indicates not on file.  Home Medications   Prior to Admission medications   Not on File   BP 0/0 mmHg  Resp 0  SpO2 0% Physical Exam  Constitutional: He appears well-developed and well-nourished. He appears toxic. He has a sickly appearance. He is intubated. Backboard in place.  HENT:  Head: Atraumatic.  Emesis on face  Eyes: Right pupil is not reactive. Right pupil is round. Left pupil  is not reactive. Left pupil is round. Pupils are equal.  Cardiovascular:  Pulses:      Radial pulses are 0 on the right side, and 0 on the left side.       Femoral pulses are 0 on the right side, and 0 on the left side. Pulmonary/Chest: He is intubated. He has no wheezes. He has no rales.  Musculoskeletal: He exhibits no edema.  Neurological: He is unresponsive.  Pulse palpated only with chest compressions, no pulse with rhythm checks  Skin: No rash noted.  Extremities cool  Nursing note and vitals reviewed.   ED Course  Procedures (including critical care time) Labs Review Labs Reviewed - No data to display  Imaging Review No results found.   EKG Interpretation None      MDM   Final diagnoses:  Cardiac arrest    39 year old male with a history of alcohol abuse, pancreatitis presents as cardiac arrest. Patient was with an unknown down time was found down and on fire and EMS arrival he was asystole on the monitor. Chest compressions were continued and he received 1 mg of epinephrine 11 times with persistent asystole on the monitor with rhythm checks with CPR for approximately 40 minutes. He was started on cool saline per hypothermic protocol. On arrival to the emergency department endotracheal tube position was checked with color change and bilateral breath sounds.  CPR was continued. Had 2 rhythm checks were performed which both showed asystole. 1 mg of epinephrine was given. A bedside ultrasound was performed  which showed no cardiac activity.  Given patient's unknown down time, greater than 45 minutes of CPR with persistent asystole, did not feel that continuing resuscitation efforts would result in any meaningful outcome.  Patient with fixed, dilated pupils.  Resuscitation was stopped and time of death was called at 1906.  He will be a Adult nurse case.  Fiance called and was notified prior to coming to ED. Family (sister) was notified and notified the rest of the family who  came to the ED.      Rhae Lerner, MD 09/03/14 0130  Gerhard Munch, MD 09/04/14 [phone]

## 2014-09-02 NOTE — Code Documentation (Signed)
CPR in progress at present- EDP at bedside.

## 2014-09-02 NOTE — Code Documentation (Signed)
CPR paused for rhythm and pulse check- EDP at bedside with ultrasound- no cardiac activity present per ultrasound.  Pt asystole on the monitor.

## 2014-09-02 NOTE — ED Notes (Addendum)
Pt to ED via [facility] from home- CPR in progress.  Pt family called out with unknown downtime- EMS arrived at 1920 and found pt to be pulsess and began CPR.  Total of 11 Epi, 25mg  d50, and 4mg  Narcan given prior to arrival- pt remained in asystole.  CPR in progress on arrival to exam room-pt intubated with size 8 et tube- EDP at bedside.

## 2014-09-02 NOTE — Code Documentation (Addendum)
CPR paused for pulse check- no pulse present- CPR restarted

## 2014-09-19 MED FILL — Medication: Qty: 1 | Status: AC

## 2014-09-24 DEATH — deceased

## 2016-04-03 IMAGING — CR DG KNEE COMPLETE 4+V*R*
4 series · 4 of 4 positions shown · non-contrast
Comparison: None.

CLINICAL DATA: Laceration involving the lateral portion of the knee

EXAM:
RIGHT KNEE - COMPLETE 4+ VIEW

[t knee ap right]
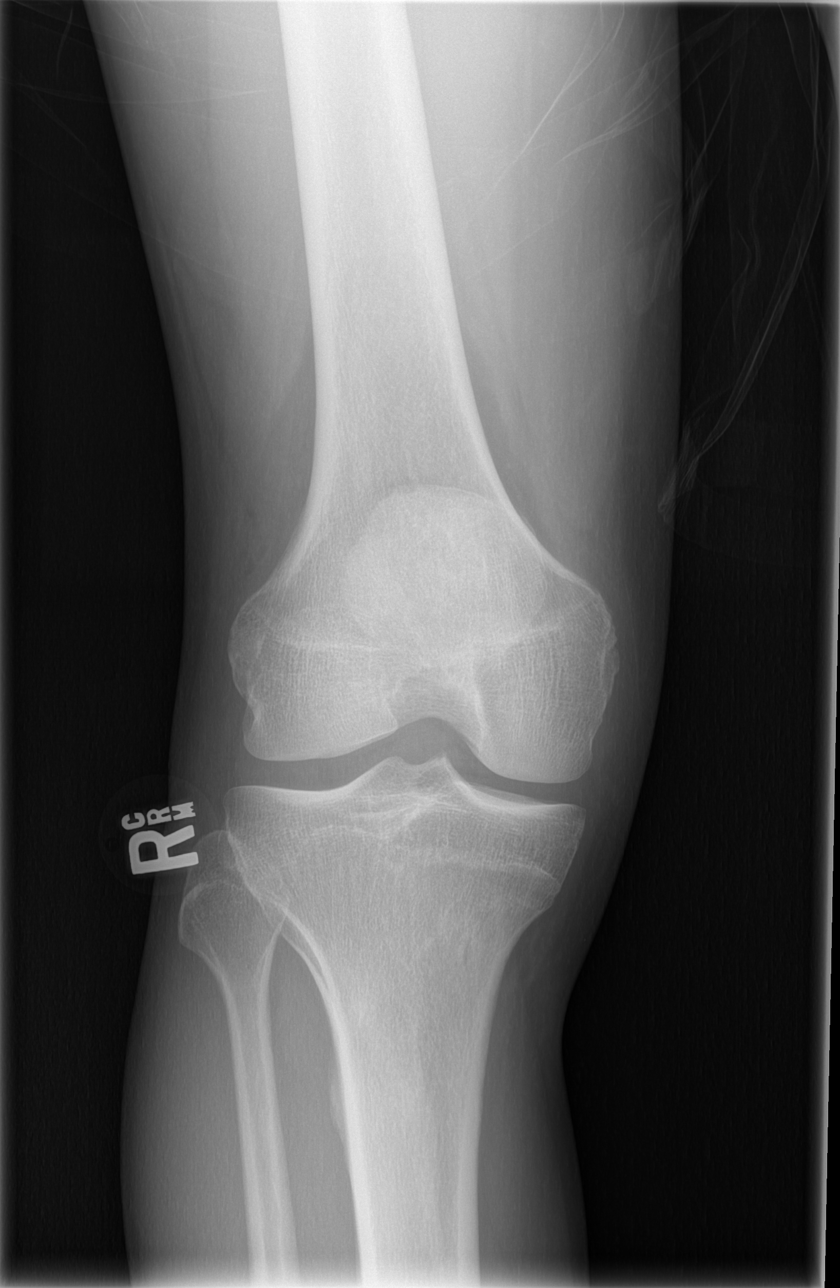

[t knee oblique right (1 of 2)]
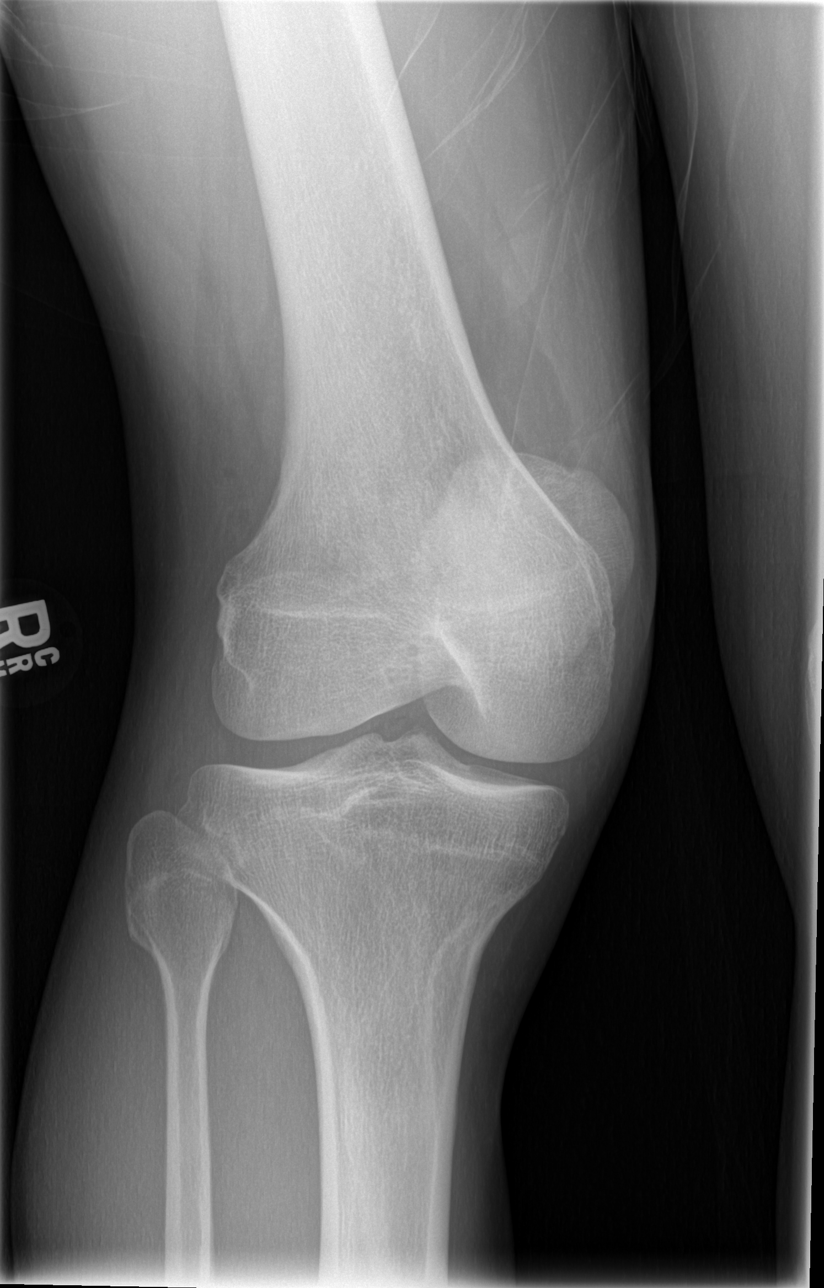

[t knee oblique right (2 of 2)]
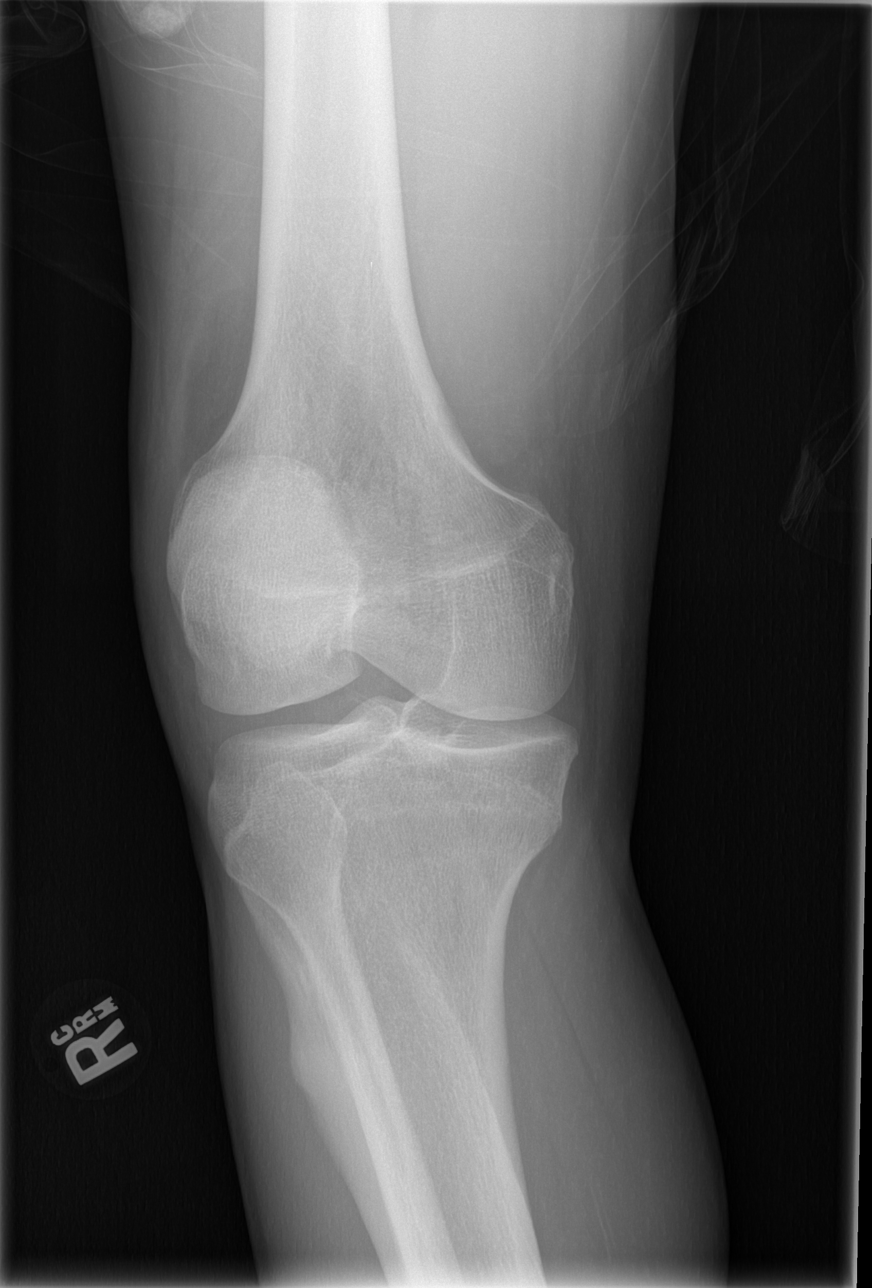

[t knee lat right]
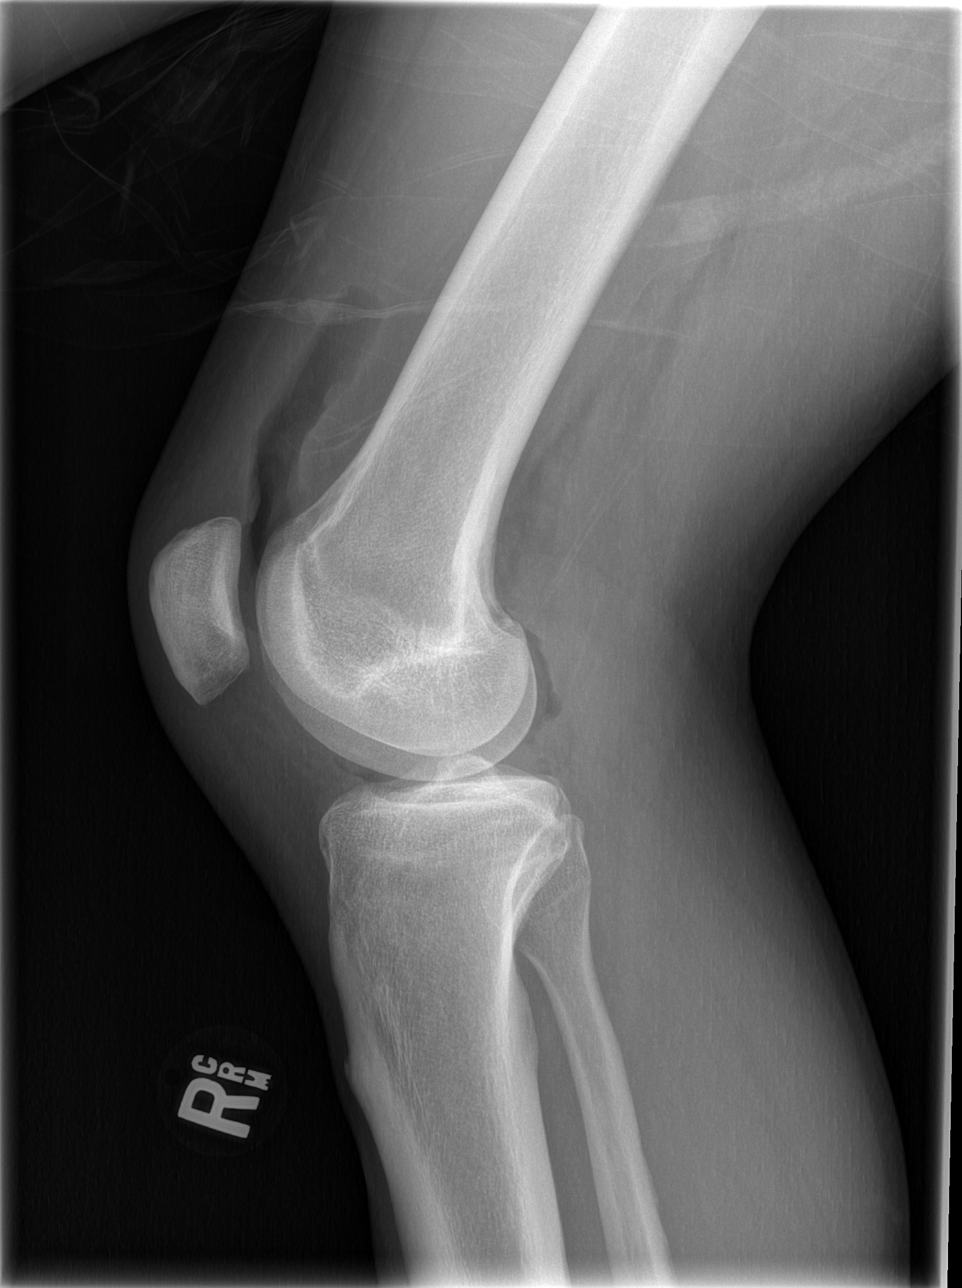

[4 of 4 positions shown; findings below may reference images not displayed]

FINDINGS: There are ill-defined foci of subcutaneous emphysema about the
lateral aspect of the knee. This finding is associated with a large
amount of intra-articular air. No associated fracture, dislocation
or radiopaque foreign body. Joint spaces are preserved. No definite
joint effusion.
IMPRESSION: 1. Ill-defined foci of subcutaneous emphysema involving the lateral
aspect of the knee with associated large amount of intra-articular
air.
2. No associated fracture or radiopaque foreign body.
